# Patient Record
Sex: Male | Born: 2000
Health system: Southern US, Community
[De-identification: ages and names within clinical notes are randomized; demographics above are authoritative.]

## PROBLEM LIST (undated history)

## (undated) DIAGNOSIS — F909 Attention-deficit hyperactivity disorder, unspecified type: Secondary | ICD-10-CM

## (undated) HISTORY — PX: HEMANGIOMA EXCISION: SHX1734

## (undated) HISTORY — PX: URETHRAL DILATION: SUR417

---
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
## 2019-02-14 ENCOUNTER — Ambulatory Visit (INDEPENDENT_AMBULATORY_CARE_PROVIDER_SITE_OTHER): Payer: Managed Care, Other (non HMO) | Admitting: Family Medicine

## 2019-02-14 ENCOUNTER — Other Ambulatory Visit: Payer: Self-pay

## 2019-02-14 ENCOUNTER — Encounter: Payer: Self-pay | Admitting: Family Medicine

## 2019-02-14 DIAGNOSIS — J329 Chronic sinusitis, unspecified: Secondary | ICD-10-CM

## 2019-02-14 DIAGNOSIS — J310 Chronic rhinitis: Secondary | ICD-10-CM | POA: Diagnosis not present

## 2019-02-14 MED ORDER — CEFPROZIL 500 MG PO TABS: 500.0000 mg | ORAL_TABLET | Freq: Two times a day (BID) | ORAL | 0 refills | Status: DC

## 2019-02-14 NOTE — Progress Notes (Signed)
   Subjective:    Patient ID: Kurt Hoover, male    DOB: 16-Dec-2000, 19 y.o.   MRN: [id number]  Fever  This is a new problem. Episode onset: 2 days ago-(Tuesday) Associated symptoms include congestion and coughing. Associated symptoms comments: fatigue.    Patient had Covid the middle of October.  Review of Systems  Constitutional: Positive for fever.  HENT: Positive for congestion.   Respiratory: Positive for cough.    Virtual Visit via Video Note  I connected with Kurt Hoover on 02/14/19 at  2:00 PM EST by a video enabled telemedicine application and verified that I am speaking with the correct person using two identifiers.  Location: Patient: home Provider: office   I discussed the limitations of evaluation and management by telemedicine and the availability of in person appointments. The patient expressed understanding and agreed to proceed.  History of Present Illness:    Observations/Objective:   Assessment and Plan:   Follow Up Instructions:    I discussed the assessment and treatment plan with the patient. The patient was provided an opportunity to ask questions and all were answered. The patient agreed with the plan and demonstrated an understanding of the instructions.   The patient was advised to call back or seek an in-person evaluation if the symptoms worsen or if the condition fails to improve as anticipated.  I provided22minutes of non-face-to-face time during this encounter. Two d ago  Felt tired and achey   Nausea  Diarrhea  Some nasal disch  Quit smoking   t 99.9   Some ha   symp started        Objective:   Physical Exam  Virtual      Assessment & Plan:  Impression viral syndrome with secondary sinusitis.  Antibiotics prescribed symptom care discussed warning signs discussed.  Chance of a recurrent COVID-19 infection extremely low.  Of course this may change in the future as other variants become more  widespread discussed with patient

## 2019-02-15 ENCOUNTER — Encounter: Payer: Self-pay | Admitting: Family Medicine

## 2019-02-18 ENCOUNTER — Ambulatory Visit: Payer: Managed Care, Other (non HMO) | Attending: Internal Medicine

## 2019-02-18 ENCOUNTER — Other Ambulatory Visit: Payer: Self-pay

## 2019-02-18 DIAGNOSIS — Z20822 Contact with and (suspected) exposure to covid-19: Secondary | ICD-10-CM | POA: Insufficient documentation

## 2019-02-19 LAB — NOVEL CORONAVIRUS, NAA: SARS-CoV-2, NAA: NOT DETECTED

## 2019-02-21 ENCOUNTER — Other Ambulatory Visit: Payer: Self-pay

## 2019-02-21 ENCOUNTER — Encounter: Payer: Self-pay | Admitting: Family Medicine

## 2019-02-21 ENCOUNTER — Ambulatory Visit (INDEPENDENT_AMBULATORY_CARE_PROVIDER_SITE_OTHER): Payer: Managed Care, Other (non HMO) | Admitting: Family Medicine

## 2019-02-21 DIAGNOSIS — J019 Acute sinusitis, unspecified: Secondary | ICD-10-CM

## 2019-02-21 DIAGNOSIS — F902 Attention-deficit hyperactivity disorder, combined type: Secondary | ICD-10-CM

## 2019-02-21 MED ORDER — AMPHETAMINE-DEXTROAMPHET ER 30 MG PO CP24: 30.0000 mg | ORAL_CAPSULE | ORAL | 0 refills | Status: DC

## 2019-02-21 MED ORDER — AZITHROMYCIN 250 MG PO TABS: ORAL_TABLET | ORAL | 0 refills | Status: DC

## 2019-02-21 NOTE — Progress Notes (Signed)
   Subjective:    Patient ID: Kurt Hoover, male    DOB: 27-Aug-2000, 19 y.o.   MRN: [id number]  HPI low grade fever, fatigue, diarrhea, nausea, congestion and cough.  Started one week ago. Had covid in October. Had covid test again on 2/8 because he states he feels the same way as when he had covid. States he had pneumonia when he had covid back in October.  Patient denies any shortness of breath right at the moment he relates a lot of fatigue tiredness unable to go to work today and was unable to work this week  Pt states his amphetamine-dextroamphetamine is making him feel jerky, nervous and anxious. States when he got brand name adderall it did not do that to him.  I did discuss with him how some insurance companies will not cover brand-name he understands this  Virtual Visit via Telephone Note  I connected with ZYIR GASSERT Hoover on 02/21/19 at  3:50 PM EST by telephone and verified that I am speaking with the correct person using two identifiers.  Location: Patient: home Provider: office   I discussed the limitations, risks, security and privacy concerns of performing an evaluation and management service by telephone and the availability of in person appointments. I also discussed with the patient that there may be a patient responsible charge related to this service. The patient expressed understanding and agreed to proceed.   History of Present Illness:    Observations/Objective:   Assessment and Plan:   Follow Up Instructions:    I discussed the assessment and treatment plan with the patient. The patient was provided an opportunity to ask questions and all were answered. The patient agreed with the plan and demonstrated an understanding of the instructions.   The patient was advised to call back or seek an in-person evaluation if the symptoms worsen or if the condition fails to improve as anticipated.  I provided 20 minutes of non-face-to-face time during  this encounter.       Review of Systems  Constitutional: Negative for activity change, chills and fever.  HENT: Positive for congestion. Negative for ear pain and rhinorrhea.   Eyes: Negative for discharge.  Respiratory: Positive for cough. Negative for shortness of breath and wheezing.   Cardiovascular: Negative for chest pain.  Gastrointestinal: Negative for abdominal pain, diarrhea, nausea and vomiting.  Genitourinary: Negative for dysuria and hematuria.  Musculoskeletal: Negative for arthralgias.  Neurological: Negative for weakness and headaches.  Psychiatric/Behavioral: Negative for behavioral problems and confusion.       Objective:   Physical Exam   Today's visit was via telephone Physical exam was not possible for this visit      Assessment & Plan:  ADD-we will try brand-name medicine but if not covered by insurance company he may need to choose a different ADD medicine  Probable acute rhinosinusitis he is not having shortness of breath or wheezing hold off on x-ray currently if progressive troubles or worse notify us antibiotics prescribed warning signs discussed work note given through the weekend return to work on Monday call us sooner if any problems

## 2019-02-22 ENCOUNTER — Encounter: Payer: Self-pay | Admitting: Family Medicine

## 2019-02-25 ENCOUNTER — Telehealth: Payer: Self-pay | Admitting: Family Medicine

## 2019-03-19 NOTE — Telephone Encounter (Signed)
error 

## 2019-05-23 ENCOUNTER — Encounter: Payer: Self-pay | Admitting: Family Medicine

## 2019-05-23 ENCOUNTER — Ambulatory Visit: Payer: Managed Care, Other (non HMO) | Attending: Internal Medicine

## 2019-05-23 ENCOUNTER — Telehealth: Payer: Self-pay | Admitting: *Deleted

## 2019-05-23 ENCOUNTER — Telehealth (INDEPENDENT_AMBULATORY_CARE_PROVIDER_SITE_OTHER): Payer: Managed Care, Other (non HMO) | Admitting: Family Medicine

## 2019-05-23 ENCOUNTER — Other Ambulatory Visit: Payer: Self-pay

## 2019-05-23 DIAGNOSIS — J029 Acute pharyngitis, unspecified: Secondary | ICD-10-CM | POA: Diagnosis not present

## 2019-05-23 DIAGNOSIS — Z20822 Contact with and (suspected) exposure to covid-19: Secondary | ICD-10-CM | POA: Insufficient documentation

## 2019-05-23 MED ORDER — AMOXICILLIN 500 MG PO CAPS: 500.0000 mg | ORAL_CAPSULE | Freq: Three times a day (TID) | ORAL | 0 refills | Status: DC

## 2019-05-23 NOTE — Telephone Encounter (Signed)
[name], wale are scheduled for a virtual visit with your provider today.    Just as we do with appointments in the office, we must obtain your consent to participate.  Your consent will be active for this visit and any virtual visit you may have with one of our providers in the next 365 days.    If you have a MyChart account, I can also send a copy of this consent to you electronically.  All virtual visits are billed to your insurance company just like a traditional visit in the office.  As this is a virtual visit, video technology does not allow for your provider to perform a traditional examination.  This may limit your provider's ability to fully assess your condition.  If your provider identifies any concerns that need to be evaluated in person or the need to arrange testing such as labs, EKG, etc, we will make arrangements to do so.    Although advances in technology are sophisticated, we cannot ensure that it will always work on either your end or our end.  If the connection with a video visit is poor, we may have to switch to a telephone visit.  With either a video or telephone visit, we are not always able to ensure that we have a secure connection.   I need to obtain your verbal consent now.   Are you willing to proceed with your visit today?   Kurt Hoover has provided verbal consent on 05/23/2019 for a virtual visit (video or telephone).   Kyra Manges, LPN 0/25/8527  7:82 AM

## 2019-05-23 NOTE — Progress Notes (Signed)
Patient ID: Kurt Hoover, male    DOB: [birth year], 19 y.o.   MRN: [id number]  Virtual Visit via Telephone Note  I connected with Kurt Hoover on 05/23/19 at  9:30 AM EDT by telephone and verified that I am speaking with the correct person using two identifiers.  Location: Patient: home Provider: office   I discussed the limitations, risks, security and privacy concerns of performing an evaluation and management service by telephone and the availability of in person appointments. I also discussed with the patient that there may be a patient responsible charge related to this service. The patient expressed understanding and agreed to proceed.   Chief Complaint  Patient presents with  . Sore Throat   Subjective:    HPI  Pt called for phone visit. Having sore throat and fever. Started 4 days ago. Tried taking hot showers and salt water gargle.  No other meds. Sore throat has worsened.  Now having some runny nose and coughing.  Non -prod cough.  Has had low grade temp at 99.54F. Was out of work yesterday. Was out of town 1 wk ago on [place] for [place].  Working in environment as Furniture conservator/restorer, not around many people. masking at work and out in community.  No sick contacts at work, no known Covid contacts. Started with allergy symptoms, but got worse with the sore throat.  Medical History Kurt Hoover has a past medical history of ADHD.   Outpatient Encounter Medications as of 05/23/2019  Medication Sig  . amphetamine-dextroamphetamine (ADDERALL XR) 30 MG 24 hr capsule Take 1 capsule (30 mg total) by mouth every morning.  Marland Kitchen amphetamine-dextroamphetamine (ADDERALL XR) 30 MG 24 hr capsule Take 1 capsule (30 mg total) by mouth every morning.  Marland Kitchen amphetamine-dextroamphetamine (ADDERALL XR) 30 MG 24 hr capsule Take 1 capsule (30 mg total) by mouth every morning.  . [DISCONTINUED] albuterol (PROVENTIL HFA;VENTOLIN HFA) 108 (90 Base) MCG/ACT inhaler Inhale 2 puffs into  the lungs every 6 (six) hours as needed for wheezing.  Marland Kitchen amoxicillin (AMOXIL) 500 MG capsule Take 1 capsule (500 mg total) by mouth 3 (three) times daily.  . [DISCONTINUED] azithromycin (ZITHROMAX Z-PAK) 250 MG tablet Take 2 tablets (500 mg) on  Day 1,  followed by 1 tablet (250 mg) once daily on Days 2 through 5.  . [DISCONTINUED] etodolac (LODINE) 400 MG tablet Take one tablet po bid prn hand pain   No facility-administered encounter medications on file as of 05/23/2019.     Review of Systems  Constitutional: Negative for chills and fever.  HENT: Positive for rhinorrhea and sore throat. Negative for congestion, ear discharge, ear pain, postnasal drip, sinus pressure, sinus pain and sneezing.   Eyes: Negative for pain, discharge and itching.  Respiratory: Positive for cough. Negative for wheezing.   Cardiovascular: Negative for chest pain.  Gastrointestinal: Negative for diarrhea, nausea and vomiting.  Skin: Negative for rash.  Neurological: Negative for headaches.     Vitals There were no vitals taken for this visit.  Objective:   Physical Exam  No PE, due to phone visit.  Assessment and Plan   1. Pharyngitis, unspecified etiology - amoxicillin (AMOXIL) 500 MG capsule; Take 1 capsule (500 mg total) by mouth 3 (three) times daily.  Dispense: 30 capsule; Refill: 0    Will treat empirically for strep pharyngitis.  Sore throat with fever. Gave amoxicillin. F/u if not improving in next 3-5 days.  Recommending robitussin/mucinex otc, tylenol or ibuprofen prn.  Increase fluids  and rest.    Also Advising to get covid testing.  Call with results and can given work note and instructions once results are back.  Advising quarantining till results back from covid testing. Symptomatic treatment given for fever and bodyaches with tylenol/ibuprofen and increase in fluids.   Call back if more SOB, coughing, persistent fever or not improving in next 5-7 days.   If positive for covid,  requires quarantining for 10 days from onset of symptoms.  Pt in agreement with plan.    F/u prn.  Follow Up Instructions:    I discussed the assessment and treatment plan with the patient. The patient was provided an opportunity to ask questions and all were answered. The patient agreed with the plan and demonstrated an understanding of the instructions.   The patient was advised to call back or seek an in-person evaluation if the symptoms worsen or if the condition fails to improve as anticipated.  I provided 15 minutes of non-face-to-face time during this encounter.

## 2019-05-24 LAB — SARS-COV-2, NAA 2 DAY TAT

## 2019-05-24 LAB — NOVEL CORONAVIRUS, NAA: SARS-CoV-2, NAA: NOT DETECTED

## 2019-07-17 ENCOUNTER — Other Ambulatory Visit: Payer: Self-pay | Admitting: Family Medicine

## 2019-07-17 NOTE — Telephone Encounter (Signed)
Needs to schedule follow-up office visit Then may pend

## 2019-07-18 NOTE — Telephone Encounter (Signed)
Scheduled 8/2

## 2019-08-12 ENCOUNTER — Encounter: Payer: Managed Care, Other (non HMO) | Admitting: Family Medicine

## 2019-08-12 NOTE — Progress Notes (Signed)
   Subjective:    Patient ID: Kurt Hoover, male    DOB: 03-30-2000, 19 y.o.   MRN: [id number]  HPI Review of Systems     Objective:   Physical Exam        Assessment & Plan:   This encounter was created in error - please disregard.

## 2019-08-22 ENCOUNTER — Other Ambulatory Visit: Payer: Self-pay | Admitting: Family Medicine

## 2019-08-23 ENCOUNTER — Encounter: Payer: Self-pay | Admitting: Family Medicine

## 2019-08-23 NOTE — Telephone Encounter (Signed)
Pt scheduled 8/31
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
## 2019-09-10 ENCOUNTER — Ambulatory Visit: Payer: Managed Care, Other (non HMO) | Admitting: Family Medicine

## 2019-09-10 ENCOUNTER — Other Ambulatory Visit: Payer: Self-pay

## 2019-09-10 ENCOUNTER — Ambulatory Visit
Admission: EM | Admit: 2019-09-10 | Discharge: 2019-09-10 | Disposition: A | Discharge status: Home / Self Care | Payer: Managed Care, Other (non HMO) | Attending: Emergency Medicine | Admitting: Emergency Medicine

## 2019-09-10 DIAGNOSIS — R110 Nausea: Secondary | ICD-10-CM

## 2019-09-10 DIAGNOSIS — Z20822 Contact with and (suspected) exposure to covid-19: Secondary | ICD-10-CM

## 2019-09-10 DIAGNOSIS — R519 Headache, unspecified: Secondary | ICD-10-CM

## 2019-09-10 MED ORDER — ONDANSETRON HCL 4 MG PO TABS: 4.0000 mg | ORAL_TABLET | Freq: Four times a day (QID) | ORAL | 0 refills | Status: DC

## 2019-09-10 NOTE — ED Triage Notes (Signed)
See provider note. 

## 2019-09-10 NOTE — ED Provider Notes (Signed)
[facility]   [id number] 09/10/19 Arrival Time: 1615   CC: COVID symptoms  SUBJECTIVE: History from: patient.  Kurt Hoover is a 19 y.o. male who presents with nausea, headache, and constipation x 2 days.  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  Has tried OTC medications without relief.  Denies aggravating factors.   Denies previous COVID infection.   Denies fever, chills, fatigue, sinus pain, rhinorrhea, sore throat, SOB, wheezing, chest pain, vomiting.   ROS: As per HPI.  All other pertinent ROS negative.     Past Medical History:  Diagnosis Date  . ADHD    Past Surgical History:  Procedure Laterality Date  . HEMANGIOMA EXCISION    . URETHRAL DILATION     No Known Allergies No current facility-administered medications on file prior to encounter.   Current Outpatient Medications on File Prior to Encounter  Medication Sig Dispense Refill  . ADDERALL XR 30 MG 24 hr capsule TAKE (1) CAPSULE BY MOUTH EACH MORNING. 30 capsule 0  . amphetamine-dextroamphetamine (ADDERALL XR) 30 MG 24 hr capsule Take 1 capsule (30 mg total) by mouth every morning. 30 capsule 0  . amphetamine-dextroamphetamine (ADDERALL XR) 30 MG 24 hr capsule Take 1 capsule (30 mg total) by mouth every morning. 30 capsule 0   Social History   Socioeconomic History  . Marital status: Single    Spouse name: Not on file  . Number of children: Not on file  . Years of education: Not on file  . Highest education level: Not on file  Occupational History  . Not on file  Tobacco Use  . Smoking status: Never Smoker  . Smokeless tobacco: Never Used  Vaping Use  . Vaping Use: Every day  Substance and Sexual Activity  . Alcohol use: No  . Drug use: No  . Sexual activity: Not on file  Other Topics Concern  . Not on file  Social History Narrative  . Not on file   Social Determinants of Health   Financial Resource Strain:   . Difficulty of Paying Living Expenses: Not on file  Food  Insecurity:   . Worried About Programme researcher, broadcasting/film/video in the Last Year: Not on file  . Ran Out of Food in the Last Year: Not on file  Transportation Needs:   . Lack of Transportation (Medical): Not on file  . Lack of Transportation (Non-Medical): Not on file  Physical Activity:   . Days of Exercise per Week: Not on file  . Minutes of Exercise per Session: Not on file  Stress:   . Feeling of Stress : Not on file  Social Connections:   . Frequency of Communication with Friends and Family: Not on file  . Frequency of Social Gatherings with Friends and Family: Not on file  . Attends Religious Services: Not on file  . Active Member of Clubs or Organizations: Not on file  . Attends Banker Meetings: Not on file  . Marital Status: Not on file  Intimate Partner Violence:   . Fear of Current or Ex-Partner: Not on file  . Emotionally Abused: Not on file  . Physically Abused: Not on file  . Sexually Abused: Not on file   No family history on file.  OBJECTIVE:  Vitals:   09/10/19 1632  BP: 112/79  Pulse: 81  Resp: 17  Temp: 98.6 F (37 C)  TempSrc: Tympanic  SpO2: 98%  Weight: 150 lb (68 kg)     General  appearance: alert; well-appearing, nontoxic; speaking in full sentences and tolerating own secretions HEENT: NCAT; Ears: EACs clear, TMs pearly gray; Eyes: PERRL.  EOM grossly intact.Nose: nares patent without rhinorrhea, Throat: oropharynx clear, tonsils non erythematous or enlarged, uvula midline  Neck: supple without LAD Lungs: unlabored respirations, symmetrical air entry; cough: absent; no respiratory distress; CTAB Heart: regular rate and rhythm.  Abdomen: soft, nondistended, normal active bowel sounds; nontender to palpation; no guarding  Skin: warm and dry Psychological: alert and cooperative; normal mood and affect   ASSESSMENT & PLAN:  1. Acute nonintractable headache, unspecified headache type   2. Nausea without vomiting   3. Exposure to COVID-19 virus      Meds ordered this encounter  Medications  . ondansetron (ZOFRAN) 4 MG tablet    Sig: Take 1 tablet (4 mg total) by mouth every 6 (six) hours.    Dispense:  12 tablet    Refill:  0    Order Specific Question:   Supervising Provider    Answer:   Eustace Moore [[id number]]    COVID testing ordered.  It will take between 5-7 days for test results.  Someone will contact you regarding abnormal results.    In the meantime: You should remain isolated in your home for 10 days from symptom onset AND greater than 72 hours after symptoms resolution (absence of fever without the use of fever-reducing medication and improvement in respiratory symptoms), whichever is longer Get plenty of rest and push fluids zofran as needed for nausea Try miralax twice a day for constipation.  Drink half your body weight in ounces.  Try miralax in coffee for an extra kick Use medications daily for symptom relief Use OTC medications like ibuprofen or tylenol as needed fever or pain Call or go to the ED if you have any new or worsening symptoms such as fever, cough, shortness of breath, chest tightness, chest pain, turning blue, changes in mental status, etc...   Reviewed expectations re: course of current medical issues. Questions answered. Outlined signs and symptoms indicating need for more acute intervention. Patient verbalized understanding. After Visit Summary given.         Rennis Harding, PA-C 09/10/19 1647

## 2019-09-10 NOTE — Discharge Instructions (Signed)
COVID testing ordered.  It will take between 5-7 days for test results.  Someone will contact you regarding abnormal results.    In the meantime: You should remain isolated in your home for 10 days from symptom onset AND greater than 72 hours after symptoms resolution (absence of fever without the use of fever-reducing medication and improvement in respiratory symptoms), whichever is longer Get plenty of rest and push fluids zofran as needed for nausea Try miralax twice a day for constipation.  Drink half your body weight in ounces.  Try miralax in coffee for an extra kick Use medications daily for symptom relief Use OTC medications like ibuprofen or tylenol as needed fever or pain Call or go to the ED if you have any new or worsening symptoms such as fever, cough, shortness of breath, chest tightness, chest pain, turning blue, changes in mental status, etc..Marland Kitchen

## 2019-09-12 LAB — NOVEL CORONAVIRUS, NAA: SARS-CoV-2, NAA: NOT DETECTED

## 2019-12-10 ENCOUNTER — Ambulatory Visit
Admission: EM | Admit: 2019-12-10 | Discharge: 2019-12-10 | Disposition: A | Discharge status: Home / Self Care | Payer: BC Managed Care – PPO | Attending: Emergency Medicine | Admitting: Emergency Medicine

## 2019-12-10 ENCOUNTER — Other Ambulatory Visit: Payer: Self-pay

## 2019-12-10 ENCOUNTER — Encounter: Payer: Self-pay | Admitting: Emergency Medicine

## 2019-12-10 DIAGNOSIS — J029 Acute pharyngitis, unspecified: Secondary | ICD-10-CM | POA: Diagnosis not present

## 2019-12-10 LAB — POCT RAPID STREP A (OFFICE): Rapid Strep A Screen: NEGATIVE

## 2019-12-10 MED ORDER — LIDOCAINE VISCOUS HCL 2 % MT SOLN: 15.0000 mL | OROMUCOSAL | 0 refills | Status: DC | PRN

## 2019-12-10 NOTE — ED Triage Notes (Signed)
Patient states that he has sore throat now for last 4 days. wants a strep test. neg covid test x 2 days ago.

## 2019-12-10 NOTE — ED Provider Notes (Signed)
[facility]   [id number] 12/10/19 Arrival Time: 1250  [id number] THROAT  SUBJECTIVE: History from: patient.  Kurt Hoover is a 19 y.o. male who presented to the urgent care for complaint of sore throat for the past 4 days.  Reported negative COVID-19 test 2 days ago.  Denies sick exposure to strep, flu or mono, or precipitating event.  Has tried OTC medication without relief.  Symptoms are made worse with swallowing, but tolerating liquids and own secretions without difficulty.  Denies previous symptoms in the past.  Denies fever, chills, fatigue, ear pain, sinus pain, rhinorrhea, nasal congestion, cough, SOB, wheezing, chest pain, nausea, rash, changes in bowel or bladder habits.     ROS: As per HPI.  All other pertinent ROS negative.      Past Medical History:  Diagnosis Date  . ADHD    Past Surgical History:  Procedure Laterality Date  . HEMANGIOMA EXCISION    . URETHRAL DILATION     No Known Allergies No current facility-administered medications on file prior to encounter.   Current Outpatient Medications on File Prior to Encounter  Medication Sig Dispense Refill  . ADDERALL XR 30 MG 24 hr capsule TAKE (1) CAPSULE BY MOUTH EACH MORNING. 30 capsule 0  . amphetamine-dextroamphetamine (ADDERALL XR) 30 MG 24 hr capsule Take 1 capsule (30 mg total) by mouth every morning. 30 capsule 0  . amphetamine-dextroamphetamine (ADDERALL XR) 30 MG 24 hr capsule Take 1 capsule (30 mg total) by mouth every morning. 30 capsule 0  . ondansetron (ZOFRAN) 4 MG tablet Take 1 tablet (4 mg total) by mouth every 6 (six) hours. 12 tablet 0   Social History   Socioeconomic History  . Marital status: Single    Spouse name: Not on file  . Number of children: Not on file  . Years of education: Not on file  . Highest education level: Not on file  Occupational History  . Not on file  Tobacco Use  . Smoking status: Never Smoker  . Smokeless tobacco: Never Used  Vaping Use  .  Vaping Use: Every day  Substance and Sexual Activity  . Alcohol use: No  . Drug use: No  . Sexual activity: Not on file  Other Topics Concern  . Not on file  Social History Narrative  . Not on file   Social Determinants of Health   Financial Resource Strain:   . Difficulty of Paying Living Expenses: Not on file  Food Insecurity:   . Worried About Programme researcher, broadcasting/film/video in the Last Year: Not on file  . Ran Out of Food in the Last Year: Not on file  Transportation Needs:   . Lack of Transportation (Medical): Not on file  . Lack of Transportation (Non-Medical): Not on file  Physical Activity:   . Days of Exercise per Week: Not on file  . Minutes of Exercise per Session: Not on file  Stress:   . Feeling of Stress : Not on file  Social Connections:   . Frequency of Communication with Friends and Family: Not on file  . Frequency of Social Gatherings with Friends and Family: Not on file  . Attends Religious Services: Not on file  . Active Member of Clubs or Organizations: Not on file  . Attends Banker Meetings: Not on file  . Marital Status: Not on file  Intimate Partner Violence:   . Fear of Current or Ex-Partner: Not on file  . Emotionally Abused: Not on  file  . Physically Abused: Not on file  . Sexually Abused: Not on file   No family history on file.  OBJECTIVE:  Vitals:   12/10/19 1316  BP: 120/76  Pulse: 80  Resp: 16  Temp: 98 F (36.7 C)  SpO2: 98%     General appearance: alert; appears fatigued, but nontoxic, speaking in full sentences and managing own secretions HEENT: NCAT; Ears: EACs clear, TMs pearly gray with visible cone of light, without erythema; Eyes: PERRL, EOMI grossly; Nose: no obvious rhinorrhea; Throat: oropharynx clear, tonsils 1+ and mildly erythematous without white tonsillar exudates, uvula midline Neck: supple without LAD Lungs: CTA bilaterally without adventitious breath sounds; cough absent Heart: regular rate and rhythm.   Radial pulses 2+ symmetrical bilaterally Skin: warm and dry Psychological: alert and cooperative; normal mood and affect  LABS: Results for orders placed or performed during the hospital encounter of 12/10/19 (from the past 24 hour(s))  POCT rapid strep A     Status: None   Collection Time: 12/10/19  1:53 PM  Result Value Ref Range   Rapid Strep A Screen Negative Negative     ASSESSMENT & PLAN:  1. Sore throat     Meds ordered this encounter  Medications  . lidocaine (XYLOCAINE) 2 % solution    Sig: Use as directed 15 mLs in the mouth or throat as needed for mouth pain.    Dispense:  100 mL    Refill:  0   Discharge instructions   Strep test negative, will send out for culture and we will call you with results Viscous lidocaine prescribed.  This is an oral solution you can swish, and gargle as needed for symptomatic relief of sore throat.  Do not exceed 8 doses in a 24 hour period.  Do not use prior to eating, as this will numb your entire mouth.   Drink warm or cool liquids, use throat lozenges, or popsicles to help alleviate symptoms Take OTC ibuprofen or tylenol as needed for pain Follow up with PCP if symptoms persists Return or go to ER if patient has any new or worsening symptoms such as fever, chills, nausea, vomiting, worsening sore throat, cough, abdominal pain, chest pain, changes in bowel or bladder habits, etc...  Reviewed expectations re: course of current medical issues. Questions answered. Outlined signs and symptoms indicating need for more acute intervention. Patient verbalized understanding. After Visit Summary given.         Durward Parcel, FNP 12/10/19 1404

## 2019-12-10 NOTE — Discharge Instructions (Signed)
Strep test negative, will send out for culture and we will call you with results Viscous lidocaine prescribed.  This is an oral solution you can swish, and gargle as needed for symptomatic relief of sore throat.  Do not exceed 8 doses in a 24 hour period.  Do not use prior to eating, as this will numb your entire mouth.   Drink warm or cool liquids, use throat lozenges, or popsicles to help alleviate symptoms Take OTC ibuprofen or tylenol as needed for pain Follow up with PCP if symptoms persists Return or go to ER if patient has any new or worsening symptoms such as fever, chills, nausea, vomiting, worsening sore throat, cough, abdominal pain, chest pain, changes in bowel or bladder habits, etc..Marland Kitchen

## 2019-12-13 LAB — CULTURE, GROUP A STREP (THRC)

## 2020-07-28 ENCOUNTER — Other Ambulatory Visit: Payer: Self-pay

## 2020-07-28 ENCOUNTER — Ambulatory Visit
Admission: EM | Admit: 2020-07-28 | Discharge: 2020-07-28 | Disposition: A | Discharge status: Home / Self Care | Payer: BC Managed Care – PPO | Attending: Urgent Care | Admitting: Urgent Care

## 2020-07-28 ENCOUNTER — Encounter: Payer: Self-pay | Admitting: Emergency Medicine

## 2020-07-28 DIAGNOSIS — R112 Nausea with vomiting, unspecified: Secondary | ICD-10-CM

## 2020-07-28 DIAGNOSIS — A084 Viral intestinal infection, unspecified: Secondary | ICD-10-CM

## 2020-07-28 MED ORDER — LOPERAMIDE HCL 2 MG PO CAPS: 2.0000 mg | ORAL_CAPSULE | Freq: Two times a day (BID) | ORAL | 0 refills | Status: DC | PRN

## 2020-07-28 MED ORDER — ONDANSETRON 8 MG PO TBDP: 8.0000 mg | ORAL_TABLET | Freq: Three times a day (TID) | ORAL | 0 refills | Status: DC | PRN

## 2020-07-28 NOTE — ED Triage Notes (Addendum)
Vomiting that started on Sunday and headache.  No vomiting since then but having sharp stomach pains and body aches.  2 neg at home covid test

## 2020-07-28 NOTE — ED Provider Notes (Signed)
[facility]   MRN: [id number] DOB: 2000/09/11  Subjective:   Kurt Hoover is a 20 y.o. male presenting for 3-day history of diarrhea, abdominal pain, malaise, headache and vomiting.  Initially symptoms started out with headache and vomiting but only had 1 episode of this.  He has had much more profuse diarrhea which has improved today but is still there.  He did have contact with somebody that had traveled and had similar symptoms.  He took 2 test for COVID-19 and both were negative.  Has a history of COVID and reports that this feels different.  Denies cough, chest pain, shortness of breath.  No current facility-administered medications for this encounter.  Current Outpatient Medications:    ADDERALL XR 30 MG 24 hr capsule, TAKE (1) CAPSULE BY MOUTH EACH MORNING., Disp: 30 capsule, Rfl: 0   amphetamine-dextroamphetamine (ADDERALL XR) 30 MG 24 hr capsule, Take 1 capsule (30 mg total) by mouth every morning., Disp: 30 capsule, Rfl: 0   amphetamine-dextroamphetamine (ADDERALL XR) 30 MG 24 hr capsule, Take 1 capsule (30 mg total) by mouth every morning., Disp: 30 capsule, Rfl: 0   lidocaine (XYLOCAINE) 2 % solution, Use as directed 15 mLs in the mouth or throat as needed for mouth pain., Disp: 100 mL, Rfl: 0   ondansetron (ZOFRAN) 4 MG tablet, Take 1 tablet (4 mg total) by mouth every 6 (six) hours., Disp: 12 tablet, Rfl: 0   No Known Allergies  Past Medical History:  Diagnosis Date   ADHD      Past Surgical History:  Procedure Laterality Date   HEMANGIOMA EXCISION     URETHRAL DILATION      No family history on file.  Social History   Tobacco Use   Smoking status: Never   Smokeless tobacco: Never  Vaping Use   Vaping Use: Every day  Substance Use Topics   Alcohol use: No   Drug use: No    ROS   Objective:   Vitals: BP 123/79 (BP Location: Right Arm)   Pulse 86   Temp 98 F (36.7 C) (Oral)   Resp 18   SpO2 97%   Physical  Exam Constitutional:      General: He is not in acute distress.    Appearance: Normal appearance. He is well-developed. He is not ill-appearing, toxic-appearing or diaphoretic.  HENT:     Head: Normocephalic and atraumatic.     Right Ear: External ear normal.     Left Ear: External ear normal.     Nose: Nose normal.     Mouth/Throat:     Mouth: Mucous membranes are moist.     Pharynx: Oropharynx is clear.  Eyes:     General: No scleral icterus.    Extraocular Movements: Extraocular movements intact.     Pupils: Pupils are equal, round, and reactive to light.  Cardiovascular:     Rate and Rhythm: Normal rate and regular rhythm.     Heart sounds: Normal heart sounds. No murmur heard.   No friction rub. No gallop.  Pulmonary:     Effort: Pulmonary effort is normal. No respiratory distress.     Breath sounds: Normal breath sounds. No stridor. No wheezing, rhonchi or rales.  Abdominal:     General: There is no distension.     Palpations: Abdomen is soft. There is no mass.     Tenderness: There is abdominal tenderness (mild over mid abdomen). There is no guarding or rebound.  Comments: Slightly hyperactive bowel sounds.  Skin:    General: Skin is warm and dry.  Neurological:     Mental Status: He is alert and oriented to person, place, and time.  Psychiatric:        Mood and Affect: Mood normal.        Behavior: Behavior normal.        Thought Content: Thought content normal.      Assessment and Plan :   PDMP not reviewed this encounter.  1. Viral gastroenteritis   2. Nausea vomiting and diarrhea     Will manage for suspected viral gastroenteritis, colitis with supportive care.  Recommended patient hydrate well, eat light meals and maintain electrolytes.  Will use Zofran and Imodium for nausea, vomiting and diarrhea.  Deferred COVID-19 testing.  Counseled patient on potential for adverse effects with medications prescribed/recommended today, ER and return-to-clinic  precautions discussed, patient verbalized understanding.    Wallis Bamberg, PA-C 07/28/20 1630

## 2020-07-28 NOTE — Discharge Instructions (Addendum)

## 2020-11-24 ENCOUNTER — Ambulatory Visit: Payer: Self-pay

## 2020-11-26 ENCOUNTER — Other Ambulatory Visit: Payer: Self-pay

## 2020-11-26 ENCOUNTER — Ambulatory Visit (INDEPENDENT_AMBULATORY_CARE_PROVIDER_SITE_OTHER): Payer: Self-pay | Admitting: Family Medicine

## 2020-11-26 ENCOUNTER — Encounter: Payer: Self-pay | Admitting: Family Medicine

## 2020-11-26 VITALS — BP 120/84 | HR 77 | Ht 68.0 in | Wt 185.0 lb

## 2020-11-26 DIAGNOSIS — M7582 Other shoulder lesions, left shoulder: Secondary | ICD-10-CM

## 2020-11-26 MED ORDER — DICLOFENAC SODIUM 75 MG PO TBEC: 75.0000 mg | DELAYED_RELEASE_TABLET | Freq: Two times a day (BID) | ORAL | 0 refills | Status: DC

## 2020-11-26 NOTE — Progress Notes (Signed)
   Subjective:    Patient ID: Kurt Hoover, male    DOB: January 23, 2000, 20 y.o.   MRN: [id number]  HPI Patient is here for L shoulder sharp pain , x  1 week - not sure if happened at work or home Hurts with certain movements.  Causes pain discomfort.  Does not tolerate movement well.  Review of Systems     Objective:   Physical Exam  Decreased range of motion left shoulder Strength good except for internal rotation No signs of a tear This is more tendinosis/tendinitis.  Neck is normal trapezius normal lungs clear heart regular      Assessment & Plan:  Left shoulder pain Recommend gentle range of motion exercises Anti-inflammatories as directed If not improving over the next 6 weeks notify us we will help set up with orthopedics for physical therapy and injections

## 2021-02-16 ENCOUNTER — Other Ambulatory Visit: Payer: Self-pay

## 2021-02-16 ENCOUNTER — Telehealth: Payer: Self-pay | Admitting: Nurse Practitioner

## 2021-02-16 ENCOUNTER — Ambulatory Visit (INDEPENDENT_AMBULATORY_CARE_PROVIDER_SITE_OTHER): Payer: Self-pay | Admitting: Nurse Practitioner

## 2021-02-16 ENCOUNTER — Encounter: Payer: Self-pay | Admitting: Nurse Practitioner

## 2021-02-16 DIAGNOSIS — J029 Acute pharyngitis, unspecified: Secondary | ICD-10-CM

## 2021-02-16 DIAGNOSIS — R1030 Lower abdominal pain, unspecified: Secondary | ICD-10-CM

## 2021-02-16 MED ORDER — ONDANSETRON HCL 4 MG PO TABS: 4.0000 mg | ORAL_TABLET | Freq: Three times a day (TID) | ORAL | 0 refills | Status: DC | PRN

## 2021-02-16 NOTE — Telephone Encounter (Signed)
Mr. janmichael, giraud are scheduled for a virtual visit with your provider today.    Just as we do with appointments in the office, we must obtain your consent to participate.  Your consent will be active for this visit and any virtual visit you may have with one of our providers in the next 365 days.    If you have a MyChart account, I can also send a copy of this consent to you electronically.  All virtual visits are billed to your insurance company just like a traditional visit in the office.  As this is a virtual visit, video technology does not allow for your provider to perform a traditional examination.  This may limit your provider's ability to fully assess your condition.  If your provider identifies any concerns that need to be evaluated in person or the need to arrange testing such as labs, EKG, etc, we will make arrangements to do so.    Although advances in technology are sophisticated, we cannot ensure that it will always work on either your end or our end.  If the connection with a video visit is poor, we may have to switch to a telephone visit.  With either a video or telephone visit, we are not always able to ensure that we have a secure connection.   I need to obtain your verbal consent now.   Are you willing to proceed with your visit today?   DARONTE SHOSTAK II has provided verbal consent on 02/16/2021 for a virtual visit (video or telephone).   Marlowe Shores, LPN 06/11/8364  2:94 PM

## 2021-02-16 NOTE — Progress Notes (Signed)
° °  Subjective:    Patient ID: Kurt Hoover, male    DOB: 2000-04-04, 21 y.o.   MRN: [id number]  HPI   Virtual Visit via Telephone Note  I connected with Kurt Hoover on 02/16/21 at  3:00 PM EST by telephone and verified that I am speaking with the correct person using two identifiers.  Location: Patient: home Provider: office   I discussed the limitations, risks, security and privacy concerns of performing an evaluation and management service by telephone and the availability of in person appointments. I also discussed with the patient that there may be a patient responsible charge related to this service. The patient expressed understanding and agreed to proceed.   History of Present Illness:  Patient reports to clinic via telemedicine with complaints of sore throat, fevers, nasal congestion, nausea, diarrhea, lower abdominal pain that started 4-5 days ago.  Patient states that he has been fever free for 3 days and that his nasal congestion and sore throat are better.  However, patient still complains of intermittent abdominal pain to his lower abdomen that he describes as sharp.  Patient states abdominal pain is worse after eating greasy meals.  Patient states his last episode of diarrhea was last night and that he continues to be nauseous.  Patient denies any vomiting, blood in stool, headache, dizziness.    Patient did 2 home COVID test that were negative.  Observations/Objective: Patient took telehealth visit while at work.  Patient answered all questions appropriately.  No signs and symptoms of distress were noted during telemedicine phone call.  Assessment and Plan:  1. Lower abdominal pain -Due to limitations of telemedicine unable to fully assess differentials. -However suspect possible gallbladder etiology. -Patient instructed to get the following labs: CMP, amylase, lipase, CBC. - ondansetron (ZOFRAN) 4 MG tablet; Take 1 tablet (4 mg total) by mouth  every 8 (eight) hours as needed for nausea or vomiting.  Dispense: 20 tablet; Refill: 0 -Limitations of telemedicine visit discussed in detail. -Patient instructed to come to clinic to be evaluated in 1 to 2 days if symptoms are not improved. -Patient unsure if he can get lab work until Thursday due to transportation issues. -Patient instructed to go to the ED if he develops vomiting or if abdominal pain gets worse.   2. Sore throat -Likely viral etiology -Reassurance that patient is getting better -Come to the clinic to be evaluated if symptoms return.   Follow Up Instructions:    I discussed the assessment and treatment plan with the patient. The patient was provided an opportunity to ask questions and all were answered. The patient agreed with the plan and demonstrated an understanding of the instructions.   The patient was advised to call back or seek an in-person evaluation if the symptoms worsen or if the condition fails to improve as anticipated.  I provided 15 minutes of non-face-to-face time during this encounter.

## 2021-04-19 ENCOUNTER — Encounter: Payer: Self-pay | Admitting: Family Medicine

## 2021-04-19 ENCOUNTER — Ambulatory Visit (INDEPENDENT_AMBULATORY_CARE_PROVIDER_SITE_OTHER): Payer: 59 | Admitting: Family Medicine

## 2021-04-19 VITALS — BP 130/80 | HR 90 | Temp 98.6°F | Ht 68.0 in | Wt 163.0 lb

## 2021-04-19 DIAGNOSIS — A084 Viral intestinal infection, unspecified: Secondary | ICD-10-CM | POA: Diagnosis not present

## 2021-04-19 MED ORDER — ONDANSETRON 8 MG PO TBDP: 8.0000 mg | ORAL_TABLET | Freq: Three times a day (TID) | ORAL | 1 refills | Status: DC | PRN

## 2021-04-19 NOTE — Progress Notes (Signed)
? ?  Subjective:  ? ? Patient ID: Kurt Hoover, male    DOB: 09-13-2000, 21 y.o.   MRN: [id number] ? ?Emesis  ?Associated symptoms include abdominal pain.  ?Started off early this morning multiple episodes of vomiting 8-9 times.  No fever no bloody stools.  Energy level subpar.  Did have intermittent abdominal pain ?No abdominal pain currently. ? ?Review of Systems  ?Gastrointestinal:  Positive for abdominal pain and vomiting.  ?X 1  day - symptoms down to nausea only today ? ?   ?Objective:  ? Physical Exam ?General-in no acute distress ?Eyes-no discharge ?Lungs-respiratory rate normal, CTA ?CV-no murmurs,RRR ?Extremities skin warm dry no edema ?Neuro grossly normal ?Behavior normal, alert ? ?Denies any abdominal pain no tenderness or guarding or rebound ? ? ?   ?Assessment & Plan:  ? ?Viral gastroenteritis ?Supportive measures ?Fluids bland diet ?Zofran as needed ? ? ?Patient did have alcohol misuse earlier last year but is staying away from alcohol currently ? ? ?

## 2021-10-13 ENCOUNTER — Ambulatory Visit: Payer: BC Managed Care – PPO | Admitting: Nurse Practitioner

## 2021-10-13 VITALS — BP 118/76 | Temp 97.5°F | Ht 68.0 in

## 2021-10-13 DIAGNOSIS — G43009 Migraine without aura, not intractable, without status migrainosus: Secondary | ICD-10-CM

## 2021-10-13 MED ORDER — BUPROPION HCL ER (XL) 150 MG PO TB24: 150.0000 mg | ORAL_TABLET | Freq: Every day | ORAL | 0 refills | Status: DC

## 2021-10-13 NOTE — Progress Notes (Signed)
Subjective:    Patient ID: Kurt Hoover, male    DOB: [birth year], 21 y.o.   MRN: [id number]  HPI  Patient arrives with headache off and on for a week an had a bad migraine last night that caused vomiting.   Patient states he had migraines as a child however recently migraines have been more frequent and intense.  Patient admits to getting 1 migraine a month that feels intense.  Patient admits to vomiting, light sensitivity, noise sensitivity.  Patient describes migraines as a left-sided headache with pressure behind his left eye.  Patient denies any vision changes, weakness, numbness tingling, instability.  Patient states that he currently uses Tylenol, hot towel, and turns off lights to help with symptoms.  Patient also admits to being stressed out more on his job.  Patient states that he works 12 hours 3 times a week and works night shift.  Patient also admits to feeling like he is having trouble concentrating.  Patient states he used to be on Adderall.  As a result of his inability to concentrate patient states he feels more stressed out because he feels he may make a mistake.   Review of Systems  Neurological:  Positive for headaches.       Objective:   Physical Exam Vitals reviewed.  Constitutional:      General: He is not in acute distress.    Appearance: Normal appearance. He is normal weight. He is not ill-appearing, toxic-appearing or diaphoretic.  HENT:     Head: Normocephalic and atraumatic.  Cardiovascular:     Rate and Rhythm: Normal rate and regular rhythm.     Pulses: Normal pulses.     Heart sounds: Normal heart sounds. No murmur heard. Pulmonary:     Effort: Pulmonary effort is normal. No respiratory distress.     Breath sounds: Normal breath sounds. No wheezing.  Musculoskeletal:     Cervical back: Normal range of motion and neck supple. No rigidity or tenderness.     Comments: Grossly intact  Lymphadenopathy:     Cervical: No cervical  adenopathy.  Skin:    General: Skin is warm.     Capillary Refill: Capillary refill takes less than 2 seconds.  Neurological:     Mental Status: He is alert.     Cranial Nerves: No cranial nerve deficit, dysarthria or facial asymmetry.     Sensory: No sensory deficit.     Motor: No weakness.     Coordination: Romberg sign negative. Coordination normal.     Gait: Gait normal.     Comments: Grossly intact  Psychiatric:        Mood and Affect: Mood normal.        Behavior: Behavior normal.           Assessment & Plan:   1. Migraine without aura and without status migrainosus, not intractable -Neuro exam normal today -Patient's migraines likely triggered by stress.  We will attempt to manage patient's ability to focus with Wellbutrin to hopefully decrease stress levels. - buPROPion (WELLBUTRIN XL) 150 MG 24 hr tablet; Take 1 tablet (150 mg total) by mouth daily.  Dispense: 30 tablet; Refill: 0 -If Wellbutrin not effective suggest that patient trial  sumatriptan and ibuprofen. -Return to clinic in 2 to 4 weeks for follow-up    Note:  This document was prepared using Dragon voice recognition software and may include unintentional dictation errors. Note - This record has been created using [employer].  Chart  creation errors have been sought, but may not always  have been located. Such creation errors do not reflect on  the standard of medical care.

## 2021-10-15 ENCOUNTER — Encounter: Payer: Self-pay | Admitting: Nurse Practitioner

## 2022-01-22 ENCOUNTER — Encounter: Payer: Self-pay | Admitting: Family Medicine

## 2022-02-19 ENCOUNTER — Other Ambulatory Visit: Payer: Self-pay | Admitting: Family Medicine

## 2022-07-22 ENCOUNTER — Encounter: Payer: Self-pay | Admitting: Family Medicine

## 2022-08-17 ENCOUNTER — Ambulatory Visit: Payer: BC Managed Care – PPO | Admitting: Family Medicine

## 2022-08-17 ENCOUNTER — Encounter: Payer: Self-pay | Admitting: Family Medicine

## 2022-11-11 ENCOUNTER — Ambulatory Visit
Admission: EM | Admit: 2022-11-11 | Discharge: 2022-11-11 | Disposition: A | Discharge status: Home / Self Care | Payer: No Typology Code available for payment source | Attending: Family Medicine | Admitting: Family Medicine

## 2022-11-11 ENCOUNTER — Encounter: Payer: Self-pay | Admitting: Emergency Medicine

## 2022-11-11 DIAGNOSIS — F17210 Nicotine dependence, cigarettes, uncomplicated: Secondary | ICD-10-CM | POA: Insufficient documentation

## 2022-11-11 DIAGNOSIS — Z1152 Encounter for screening for COVID-19: Secondary | ICD-10-CM | POA: Diagnosis not present

## 2022-11-11 DIAGNOSIS — R051 Acute cough: Secondary | ICD-10-CM | POA: Insufficient documentation

## 2022-11-11 DIAGNOSIS — J029 Acute pharyngitis, unspecified: Secondary | ICD-10-CM | POA: Diagnosis present

## 2022-11-11 LAB — POCT RAPID STREP A (OFFICE): Rapid Strep A Screen: NEGATIVE

## 2022-11-11 MED ORDER — LIDOCAINE VISCOUS HCL 2 % MT SOLN
10.0000 mL | OROMUCOSAL | 0 refills | Status: DC | PRN
Start: 2022-11-11 — End: 2023-05-23

## 2022-11-11 MED ORDER — PROMETHAZINE-DM 6.25-15 MG/5ML PO SYRP: 5.0000 mL | ORAL_SOLUTION | Freq: Four times a day (QID) | ORAL | 0 refills | Status: DC | PRN

## 2022-11-11 NOTE — ED Provider Notes (Signed)
[facility]    CSN: [id number] Arrival date & time: 11/11/22  1525      History   Chief Complaint No chief complaint on file.   HPI Kurt Hoover is a 22 y.o. male.   Presenting today with 1 day history of sore throat, cough, hoarseness, congestion.  Denies fever, chills, body aches, chest pain, shortness of breath, abdominal pain, nausea vomiting or diarrhea.  So far not tried anything over-the-counter for symptoms.  Home COVID test was negative.  No known pertinent chronic medical problems.    Past Medical History:  Diagnosis Date   ADHD     Patient Active Problem List   Diagnosis Date Noted   ADD (attention deficit disorder) 04/17/2012    Past Surgical History:  Procedure Laterality Date   HEMANGIOMA EXCISION     URETHRAL DILATION         Home Medications    Prior to Admission medications   Medication Sig Start Date End Date Taking? Authorizing Provider  lidocaine (XYLOCAINE) 2 % solution Use as directed 10 mLs in the mouth or throat every 3 (three) hours as needed. 11/11/22  Yes Particia Nearing, PA-C  promethazine-dextromethorphan (PROMETHAZINE-DM) 6.25-15 MG/5ML syrup Take 5 mLs by mouth 4 (four) times daily as needed. 11/11/22  Yes Particia Nearing, PA-C    Family History History reviewed. No pertinent family history.  Social History Social History   Tobacco Use   Smoking status: Every Day    Types: Cigarettes   Smokeless tobacco: Never  Vaping Use   Vaping status: Every Day  Substance Use Topics   Alcohol use: No   Drug use: No     Allergies   Patient has no known allergies.   Review of Systems Review of Systems Per HPI  Physical Exam Triage Vital Signs ED Triage Vitals  Encounter Vitals Group     BP 11/11/22 1550 134/84     Systolic BP Percentile --      Diastolic BP Percentile --      Pulse Rate 11/11/22 1550 81     Resp 11/11/22 1550 18     Temp 11/11/22 1550 97.9 F (36.6 C)     Temp  Source 11/11/22 1550 Oral     SpO2 11/11/22 1550 97 %     Weight --      Height --      Head Circumference --      Peak Flow --      Pain Score 11/11/22 1552 5     Pain Loc --      Pain Education --      Exclude from Growth Chart --    No data found.  Updated Vital Signs BP 134/84 (BP Location: Right Arm)   Pulse 81   Temp 97.9 F (36.6 C) (Oral)   Resp 18   SpO2 97%   Visual Acuity Right Eye Distance:   Left Eye Distance:   Bilateral Distance:    Right Eye Near:   Left Eye Near:    Bilateral Near:     Physical Exam Vitals and nursing note reviewed.  Constitutional:      Appearance: He is well-developed.  HENT:     Head: Atraumatic.     Right Ear: External ear normal.     Left Ear: External ear normal.     Nose: Nose normal.     Mouth/Throat:     Mouth: Mucous membranes are moist.  Pharynx: Oropharynx is clear. Posterior oropharyngeal erythema present. No oropharyngeal exudate.  Eyes:     Conjunctiva/sclera: Conjunctivae normal.     Pupils: Pupils are equal, round, and reactive to light.  Cardiovascular:     Rate and Rhythm: Normal rate and regular rhythm.  Pulmonary:     Effort: Pulmonary effort is normal. No respiratory distress.     Breath sounds: No wheezing or rales.  Musculoskeletal:        General: Normal range of motion.     Cervical back: Normal range of motion and neck supple.  Lymphadenopathy:     Cervical: No cervical adenopathy.  Skin:    General: Skin is warm and dry.  Neurological:     Mental Status: He is alert and oriented to person, place, and time.  Psychiatric:        Behavior: Behavior normal.      UC Treatments / Results  Labs (all labs ordered are listed, but only abnormal results are displayed) Labs Reviewed  CULTURE, GROUP A STREP ([facility])  SARS CORONAVIRUS 2 (TAT 6-24 HRS)  POCT RAPID STREP A (OFFICE)    EKG   Radiology No results found.  Procedures Procedures (including critical care time)  Medications  Ordered in UC Medications - No data to display  Initial Impression / Assessment and Plan / UC Course  I have reviewed the triage vital signs and the nursing notes.  Pertinent labs & imaging results that were available during my care of the patient were reviewed by me and considered in my medical decision making (see chart for details).     Vital signs and exam overall reassuring and suggestive of a viral respiratory infection.  Rapid strep negative, throat culture and COVID testing pending.  Discussed supportive over-the-counter medications, home care, Phenergan DM, viscous lidocaine.  Work note given.  Return for worsening symptoms.  Final Clinical Impressions(s) / UC Diagnoses   Final diagnoses:  Acute pharyngitis, unspecified etiology  Acute cough     Discharge Instructions      Rapid strep is negative, COVID test and throat culture are pending. I have sent in medications to help with symptoms and we will be in touch if results come back positive.     ED Prescriptions     Medication Sig Dispense Auth. Provider   promethazine-dextromethorphan (PROMETHAZINE-DM) 6.25-15 MG/5ML syrup Take 5 mLs by mouth 4 (four) times daily as needed. 100 mL Particia Nearing, PA-C   lidocaine (XYLOCAINE) 2 % solution Use as directed 10 mLs in the mouth or throat every 3 (three) hours as needed. 100 mL Particia Nearing, [place]      PDMP not reviewed this encounter.   Particia Nearing, [place] 11/11/22 1821

## 2022-11-11 NOTE — ED Triage Notes (Signed)
Sore throat since yesterday and cough and lost voice.  Home Covid test today was negative

## 2022-11-11 NOTE — Discharge Instructions (Signed)
Rapid strep is negative, COVID test and throat culture are pending. I have sent in medications to help with symptoms and we will be in touch if results come back positive.

## 2022-11-12 LAB — SARS CORONAVIRUS 2 (TAT 6-24 HRS): SARS Coronavirus 2: NEGATIVE

## 2022-11-14 LAB — CULTURE, GROUP A STREP (THRC)

## 2022-11-23 ENCOUNTER — Ambulatory Visit (INDEPENDENT_AMBULATORY_CARE_PROVIDER_SITE_OTHER): Payer: No Typology Code available for payment source | Admitting: Family Medicine

## 2022-11-23 VITALS — BP 123/86 | HR 88 | Temp 98.2°F | Ht 68.0 in | Wt 153.0 lb

## 2022-11-23 DIAGNOSIS — J019 Acute sinusitis, unspecified: Secondary | ICD-10-CM | POA: Diagnosis not present

## 2022-11-23 MED ORDER — AMOXICILLIN-POT CLAVULANATE 875-125 MG PO TABS: 1.0000 | ORAL_TABLET | Freq: Two times a day (BID) | ORAL | 0 refills | Status: DC

## 2022-11-23 NOTE — Patient Instructions (Signed)
Antibiotic as prescribed.  Call with concerns (if you worsen or don't improve).  Take care  Dr. Adriana Simas

## 2022-11-23 NOTE — Progress Notes (Signed)
Subjective:  Patient ID: Kurt Hoover, male    DOB: 06/24/2000  Age: 22 y.o. MRN: [id number]  CC: Respiratory symptoms   HPI:  22 year old male presents for evaluation of respiratory symptoms.  Patient has been under the weather for the past 2 weeks.  He reports cough, congestion, sore throat, hoarseness, body aches, headaches, night sweats.  Was recently evaluated urgent care and had negative COVID and strep testing.  He states that he improved symptomatically but and his symptoms worsened a few days ago.  Currently, his primary complaints are congestion, cough, and sore throat.  No fever.  Patient Active Problem List   Diagnosis Date Noted   ADD (attention deficit disorder) 04/17/2012    Social Hx   Social History   Socioeconomic History   Marital status: Single    Spouse name: Not on file   Number of children: Not on file   Years of education: Not on file   Highest education level: Not on file  Occupational History   Not on file  Tobacco Use   Smoking status: Every Day    Types: Cigarettes   Smokeless tobacco: Never  Vaping Use   Vaping status: Every Day  Substance and Sexual Activity   Alcohol use: No   Drug use: No   Sexual activity: Not on file  Other Topics Concern   Not on file  Social History Narrative   Not on file   Social Determinants of Health   Financial Resource Strain: Not on file  Food Insecurity: Not on file  Transportation Needs: Not on file  Physical Activity: Not on file  Stress: Not on file  Social Connections: Not on file    Review of Systems Per HPI  Objective:  BP 123/86   Pulse 88   Temp 98.2 F (36.8 C)   Ht 5\' 8"  (1.727 m)   Wt 153 lb (69.4 kg)   SpO2 98%   BMI 23.26 kg/m      11/23/2022    1:09 PM 11/11/2022    3:50 PM 10/13/2021   11:09 AM  BP/Weight  Systolic BP 123 134 118  Diastolic BP 86 84 76  Wt. (Lbs) 153    BMI 23.26 kg/m2      Physical Exam Vitals and nursing note reviewed.   Constitutional:      General: He is not in acute distress.    Appearance: Normal appearance.  HENT:     Head: Normocephalic and atraumatic.     Right Ear: Tympanic membrane normal.     Left Ear: Tympanic membrane normal.     Mouth/Throat:     Pharynx: Posterior oropharyngeal erythema present.  Eyes:     General:        Right eye: No discharge.        Left eye: No discharge.     Conjunctiva/sclera: Conjunctivae normal.  Cardiovascular:     Rate and Rhythm: Normal rate and regular rhythm.  Pulmonary:     Effort: Pulmonary effort is normal.     Breath sounds: Normal breath sounds. No wheezing, rhonchi or rales.  Lymphadenopathy:     Cervical: Cervical adenopathy present.  Neurological:     Mental Status: He is alert.     Lab Results  Component Value Date   WBC 10.7 10/10/2006   HGB 12.6 10/10/2006   HCT 37.1 10/10/2006   PLT 252 10/10/2006   GLUCOSE 110 (H) 10/10/2006   NA 138 10/10/2006   K 4.1  10/10/2006   CL 104 10/10/2006   CREATININE 0.59 10/10/2006   BUN 16 10/10/2006   CO2 18 (L) 10/10/2006   HGBA1C 5.4 05/22/2017     Assessment & Plan:   22 year old male presents with respiratory symptoms.  Clinical picture most consistent with sinusitis given initial improvement and then worsening of symptoms (double sickening).  Treating with Augmentin.  Meds ordered this encounter  Medications   amoxicillin-clavulanate (AUGMENTIN) 875-125 MG tablet    Sig: Take 1 tablet by mouth 2 (two) times daily.    Dispense:  20 tablet    Refill:  0    Follow-up:  Return if symptoms worsen or fail to improve.  Everlene Other DO [facility]

## 2023-05-18 ENCOUNTER — Ambulatory Visit: Payer: Self-pay

## 2023-05-18 NOTE — Telephone Encounter (Signed)
 Chief Complaint: vomiting Symptoms: 2-3 episodes today of vomiting (none yesterday), diarrhea (resolved by Monday), nausea with sweating Frequency: intermittent since Sunday Pertinent Negatives: Patient denies fever, blood in stool or vomit, head injury or abdominal injury Disposition: [] ED /[] Urgent Care (no appt availability in office) / [x] Appointment(In office/virtual)/ []  [place] [facility]/ [] Home Care/ [] Refused Recommended Disposition /[] [place] [facility]/ []  Follow-up with PCP Additional Notes: Patient's fiance, Caitlin, on the phone for triage with patient. She states she gave him some zofran  which helped with nausea/vomiting but they are concerned he may have had food poisoning from Saturday night. Patient ate out on Saturday night and started  having vomiting and diarrhea Sunday. Patient states his symptoms resolved yesterday but had episodes of vomiting today while out to eat and he had 2 sips from his margarita. Advised patient to drink walk and stick with bland diet. Acute appt scheduled tomorrow with available provider.  Copied from CRM [id number]. Topic: Clinical - Red Word Triage >> May 18, 2023  1:21 PM [place] wrote: Red Word that prompted transfer to Nurse Triage: possible food poisoning- vomiting since Saturday, unable to hold down fluids, sweats with no fever Reason for Disposition  [1] MILD or MODERATE vomiting AND [2] present > 48 hours (2 days) (Exception: Mild vomiting with associated diarrhea.)  Answer Assessment - Initial Assessment Questions 1. VOMITING SEVERITY: "How many times have you vomited in the past 24 hours?"     - MILD:  1 - 2 times/day    - MODERATE: 3 - 5 times/day, decreased oral intake without significant weight loss or symptoms of dehydration    - SEVERE: 6 or more times/day, vomits everything or nearly everything, with significant weight loss, symptoms of dehydration      3-4 times today he tried to take 2 sips of a margarita, no vomiting  yesterday.  2. ONSET: "When did the vomiting begin?"      Sunday morning.  3. FLUIDS: "What fluids or food have you vomited up today?" "Have you been able to keep any fluids down?"     Patient states he has had 3 glasses of water and was able to eat some chips.  4. ABDOMEN PAIN: "Are your having any abdomen pain?" If Yes : "How bad is it and what does it feel like?" (e.g., crampy, dull, intermittent, constant)      None at this time.  5. DIARRHEA: "Is there any diarrhea?" If Yes, ask: "How many times today?"      On Sunday he had diarrhea and it resolved on Monday.  6. CONTACTS: "Is there anyone else in the family with the same symptoms?"      Fiance states she also felt sick/nausea but did not have any vomiting.  7. CAUSE: "What do you think is causing your vomiting?"     They thought it could be related to what they ate Saturday, food poisoning.  8. HYDRATION STATUS: "Any signs of dehydration?" (e.g., dry mouth [not only dry lips], too weak to stand) "When did you last urinate?"     He states he has urinated today.  9. OTHER SYMPTOMS: "Do you have any other symptoms?" (e.g., fever, headache, vertigo, vomiting blood or coffee grounds, recent head injury)     Headache (wakes up with them, ongoing for 2 years), sweating just before vomiting when nauseated  10. PREGNANCY: "Is there any chance you are pregnant?" "When was your last menstrual period?"       N/A.  Protocols  used: Vomiting-A-AH

## 2023-05-19 ENCOUNTER — Ambulatory Visit: Admitting: Nurse Practitioner

## 2023-05-22 ENCOUNTER — Ambulatory Visit: Admitting: Physician Assistant

## 2023-05-23 ENCOUNTER — Telehealth: Admitting: Physician Assistant

## 2023-05-23 ENCOUNTER — Encounter: Payer: Self-pay | Admitting: Physician Assistant

## 2023-05-23 DIAGNOSIS — K219 Gastro-esophageal reflux disease without esophagitis: Secondary | ICD-10-CM | POA: Diagnosis not present

## 2023-05-23 DIAGNOSIS — R110 Nausea: Secondary | ICD-10-CM | POA: Diagnosis not present

## 2023-05-23 DIAGNOSIS — R197 Diarrhea, unspecified: Secondary | ICD-10-CM | POA: Diagnosis not present

## 2023-05-23 MED ORDER — ONDANSETRON HCL 4 MG PO TABS: 4.0000 mg | ORAL_TABLET | Freq: Three times a day (TID) | ORAL | 0 refills | Status: AC | PRN

## 2023-05-23 MED ORDER — OMEPRAZOLE 20 MG PO CPDR: 20.0000 mg | DELAYED_RELEASE_CAPSULE | Freq: Every day | ORAL | 1 refills | Status: AC

## 2023-05-23 NOTE — Assessment & Plan Note (Signed)
 Stable, intermittent symptoms. No vomiting. Zofran  for nausea.

## 2023-05-23 NOTE — Assessment & Plan Note (Signed)
 Patient stable today. Omeprazole for symptoms. Discussed taking medication on an empty stomach. He understands 1 week to full efficacy. Follow up in 1-2 months for in person evaluation.

## 2023-05-23 NOTE — Assessment & Plan Note (Signed)
 Patient stable. Denies blood or mucus in stool. Advised symptom tracker and food diary to monitor symptoms. CMP today. Advised avoiding foods that trigger symptoms. Will follow up for in person evaluation in 1-2 months.

## 2023-05-23 NOTE — Progress Notes (Signed)
   Virtual Visit via Video Note  I connected with Kurt Hoover on 05/23/23 at  4:20 PM EDT by a video enabled telemedicine application and verified that I am speaking with the correct person using two identifiers.  Patient Location: Home Provider Location: Home Office  I discussed the limitations, risks, security, and privacy concerns of performing an evaluation and management service by video and the availability of in person appointments. I also discussed with the patient that there may be a patient responsible charge related to this service. The patient expressed understanding and agreed to proceed.  Subjective: PCP: Bennet Brasil, MD  No chief complaint on file.  Patient presents today with concerns intermittent diarrhea, occasional nausea, and complaints of acid reflux. He reports approximately 2 months on intermittent diarrhea and nausea following spicy or greasy foods. He denies abdominal pain or vomiting. He denies a pattern to symptoms and states symptoms are unpredictable. He states occasional acid reflux symptoms following meals.      ROS: Per HPI  Current Outpatient Medications:    omeprazole (PRILOSEC) 20 MG capsule, Take 1 capsule (20 mg total) by mouth daily., Disp: 30 capsule, Rfl: 1   ondansetron  (ZOFRAN ) 4 MG tablet, Take 1 tablet (4 mg total) by mouth every 8 (eight) hours as needed for nausea or vomiting., Disp: 20 tablet, Rfl: 0  Observations/Objective: There were no vitals filed for this visit. Physical Exam Constitutional:      General: He is not in acute distress.    Appearance: Normal appearance.  Eyes:     Extraocular Movements: Extraocular movements intact.  Pulmonary:     Effort: Pulmonary effort is normal.  Musculoskeletal:     Cervical back: Normal range of motion.  Neurological:     Mental Status: He is alert.     Assessment and Plan: Diarrhea, unspecified type Assessment & Plan: Patient stable. Denies blood or mucus in stool.  Advised symptom tracker and food diary to monitor symptoms. CMP today. Advised avoiding foods that trigger symptoms. Will follow up for in person evaluation in 1-2 months.   Orders: -     [id number]+EGFR  Nausea Assessment & Plan: Stable, intermittent symptoms. No vomiting. Zofran  for nausea.   Orders: -     Ondansetron  HCl; Take 1 tablet (4 mg total) by mouth every 8 (eight) hours as needed for nausea or vomiting.  Dispense: 20 tablet; Refill: 0  Gastroesophageal reflux disease without esophagitis Assessment & Plan: Patient stable today. Omeprazole for symptoms. Discussed taking medication on an empty stomach. He understands 1 week to full efficacy. Follow up in 1-2 months for in person evaluation.   Orders: -     Omeprazole; Take 1 capsule (20 mg total) by mouth daily.  Dispense: 30 capsule; Refill: 1    Follow Up Instructions: Return in about 2 months (around 07/23/2023).   I discussed the assessment and treatment plan with the patient. The patient was provided an opportunity to ask questions, and all were answered. The patient agreed with the plan and demonstrated an understanding of the instructions.   The patient was advised to call back or seek an in-person evaluation if the symptoms worsen or if the condition fails to improve as anticipated.  The above assessment and management plan was discussed with the patient. The patient verbalized understanding of and has agreed to the management plan.   Jearlean Mince Shylin Keizer, PA-C

## 2023-11-27 ENCOUNTER — Ambulatory Visit
Admission: EM | Admit: 2023-11-27 | Discharge: 2023-11-27 | Disposition: A | Discharge status: Home / Self Care | Attending: Nurse Practitioner | Admitting: Nurse Practitioner

## 2023-11-27 DIAGNOSIS — R059 Cough, unspecified: Secondary | ICD-10-CM

## 2023-11-27 DIAGNOSIS — J209 Acute bronchitis, unspecified: Secondary | ICD-10-CM

## 2023-11-27 LAB — POC SOFIA SARS ANTIGEN FIA: SARS Coronavirus 2 Ag: NEGATIVE

## 2023-11-27 MED ORDER — ALBUTEROL SULFATE HFA 108 (90 BASE) MCG/ACT IN AERS: 2.0000 | INHALATION_SPRAY | Freq: Four times a day (QID) | RESPIRATORY_TRACT | 0 refills | Status: AC | PRN

## 2023-11-27 MED ORDER — PREDNISONE 20 MG PO TABS
40.0000 mg | ORAL_TABLET | Freq: Every day | ORAL | 0 refills | Status: AC
Start: 2023-11-27 — End: 2023-12-02

## 2023-11-27 MED ORDER — PROMETHAZINE-DM 6.25-15 MG/5ML PO SYRP: 5.0000 mL | ORAL_SOLUTION | Freq: Four times a day (QID) | ORAL | 0 refills | Status: AC | PRN

## 2023-11-27 NOTE — ED Triage Notes (Addendum)
 Pt states that he has a cough, chest congestion, sob and chest tightness. X3 days  Pt states that he has been taking cough drops.

## 2023-11-27 NOTE — Discharge Instructions (Signed)
 The COVID test was negative. Take medication as prescribed.  During the daytime, you may take over-the-counter Robitussin or Delsym for your cough. Increase fluids and allow for plenty of rest. You may take over-the-counter Tylenol  as needed for pain, fever, or general discomfort. Recommend the use of a humidifier in your bedroom at nighttime during sleep and sleeping elevated on pillows while symptoms persist. Please be advised that your cough may last from days to weeks.  If you continue to have a persistent nagging cough, but are generally feeling well, continue over-the-counter cough medications, fluids, and cough drops.  If you develop symptoms such as fever, chills, wheezing, or other concerns, seek care. Follow-up as needed.

## 2023-11-27 NOTE — ED Provider Notes (Signed)
 [facility]    CSN: [id number] Arrival date & time: 11/27/23  1645      History   Chief Complaint Chief Complaint  Patient presents with   Cough    HPI Kurt Hoover II is a 23 y.o. male.   The history is provided by the patient.   Patient presents for complaints of cough, chest congestion, and chest tightness for the past 3 days.  Patient denies fever, chills, headache, wheezing, difficulty breathing, abdominal pain, nausea, vomiting, diarrhea, or rash.  Patient states he has been taking over-the-counter cough and cold medications and using cough drops.  Patient states symptoms [place]  Denies history of asthma.  Past Medical History:  Diagnosis Date   ADHD     Patient Active Problem List   Diagnosis Date Noted   Diarrhea 05/23/2023   Nausea 05/23/2023   Gastroesophageal reflux disease without esophagitis 05/23/2023   ADD (attention deficit disorder) 04/17/2012    Past Surgical History:  Procedure Laterality Date   HEMANGIOMA EXCISION     URETHRAL DILATION         Home Medications    Prior to Admission medications   Medication Sig Start Date End Date Taking? Authorizing Provider  albuterol  (VENTOLIN  HFA) 108 (90 Base) MCG/ACT inhaler Inhale 2 puffs into the lungs every 6 (six) hours as needed. 11/27/23  Yes Leath-Warren, Etta PARAS, NP  predniSONE (DELTASONE) 20 MG tablet Take 2 tablets (40 mg total) by mouth daily with breakfast for 5 days. 11/27/23 12/02/23 Yes Leath-Warren, Etta PARAS, NP  promethazine -dextromethorphan (PROMETHAZINE -DM) 6.25-15 MG/5ML syrup Take 5 mLs by mouth 4 (four) times daily as needed. 11/27/23  Yes Leath-Warren, Etta PARAS, NP  omeprazole  (PRILOSEC) 20 MG capsule Take 1 capsule (20 mg total) by mouth daily. 05/23/23   Grooms, Courtney, PA-C  ondansetron  (ZOFRAN ) 4 MG tablet Take 1 tablet (4 mg total) by mouth every 8 (eight) hours as needed for nausea or vomiting. 05/23/23   Grooms,  [place]wend[place], PA-C    Family History History reviewed. No pertinent family history.  Social History Social History   Tobacco Use   Smoking status: Former    Types: Cigarettes   Smokeless tobacco: Never  Vaping Use   Vaping status: Every Day  Substance Use Topics   Alcohol use: Yes    Comment: occ   Drug use: No     Allergies   Patient has no known allergies.   Review of Systems Review of Systems Per HPI  Physical Exam Triage Vital Signs ED Triage Vitals  Encounter Vitals Group     BP 11/27/23 1725 (!) 145/90     Girls Systolic BP Percentile --      Girls Diastolic BP Percentile --      Boys Systolic BP Percentile --      Boys Diastolic BP Percentile --      Pulse Rate 11/27/23 1725 79     Resp 11/27/23 1725 18     Temp 11/27/23 1725 98.2 F (36.8 C)     Temp Source 11/27/23 1725 Oral     SpO2 11/27/23 1725 98 %     Weight 11/27/23 1724 170 lb (77.1 kg)     Height 11/27/23 1724 5' 9 (1.753 m)     Head Circumference --      Peak Flow --      Pain Score 11/27/23 1724 0     Pain Loc --      Pain  Education --      Exclude from [employer] Chart --    No data found.  Updated Vital Signs BP (!) 145/90 (BP Location: Right Arm)   Pulse 79   Temp 98.2 F (36.8 C) (Oral)   Resp 18   Ht 5' 9 (1.753 m)   Wt 170 lb (77.1 kg)   SpO2 98%   BMI 25.10 kg/m   Visual Acuity Right Eye Distance:   Left Eye Distance:   Bilateral Distance:    Right Eye Near:   Left Eye Near:    Bilateral Near:     Physical Exam Vitals and nursing note reviewed.  Constitutional:      General: He is not in acute distress.    Appearance: Normal appearance. He is well-developed.  HENT:     Head: Normocephalic and atraumatic.     Right Ear: Tympanic membrane, ear canal and external ear normal.     Left Ear: Tympanic membrane, ear canal and external ear normal.     Nose: Nose normal.     Right Turbinates: Enlarged and swollen.     Left Turbinates: Enlarged and swollen.     Right  Sinus: No maxillary sinus tenderness or frontal sinus tenderness.     Left Sinus: No maxillary sinus tenderness or frontal sinus tenderness.     Mouth/Throat:     Lips: Pink.     Mouth: Mucous membranes are moist.     Pharynx: Uvula midline. Postnasal drip present. No pharyngeal swelling, oropharyngeal exudate, posterior oropharyngeal erythema or uvula swelling.     Comments: Cobblestoning present to posterior oropharynx  Eyes:     Extraocular Movements: Extraocular movements intact.     Conjunctiva/sclera: Conjunctivae normal.     Pupils: Pupils are equal, round, and reactive to light.  Neck:     Thyroid: No thyromegaly.     Trachea: No tracheal deviation.  Cardiovascular:     Rate and Rhythm: Normal rate and regular rhythm.     Pulses: Normal pulses.     Heart sounds: Normal heart sounds.  Pulmonary:     Effort: Pulmonary effort is normal. Prolonged expiration present. No respiratory distress.     Breath sounds: Normal breath sounds. No stridor. No wheezing, rhonchi or rales.  Abdominal:     General: Bowel sounds are normal.     Palpations: Abdomen is soft.     Tenderness: There is no abdominal tenderness.  Musculoskeletal:     Cervical back: Normal range of motion and neck supple.  Skin:    General: Skin is warm and dry.  Neurological:     General: No focal deficit present.     Mental Status: He is alert and oriented to person, place, and time.  Psychiatric:        Mood and Affect: Mood normal.        Behavior: Behavior normal.        Thought Content: Thought content normal.        Judgment: Judgment normal.      UC Treatments / Results  Labs (all labs ordered are listed, but only abnormal results are displayed) Labs Reviewed  POC SOFIA SARS ANTIGEN FIA    EKG   Radiology No results found.  Procedures Procedures (including critical care time)  Medications Ordered in UC Medications - No data to display  Initial Impression / Assessment and Plan / UC Course   I have reviewed the triage vital signs and the nursing notes.  Pertinent labs &  imaging results that were available during my care of the patient were reviewed by me and considered in my medical decision making (see chart for details).  The COVID test was negative.  On exam, the patient's lung sounds are clear throughout, room air sats are at 98%.  The patient is well-appearing, he is in no acute distress, vital signs are stable.  Low suspicion for pneumonia.  On exam, patient with prolonged expiration, he also complains of chest tightness, will treat for acute bronchitis.  Will treat with prednisone 40 mg, and albuterol  inhaler, and Promethazine  DM for the cough.  Supportive care recommendations were provided and discussed with the patient to include fluids, rest, over-the-counter analgesics, and use of a humidifier during sleep.  Discussed indications with the patient regarding follow-up.  Patient was in agreement with this plan of care and verbalizes understanding.  All questions were answered.  Patient stable for discharge.   Final Clinical Impressions(s) / UC Diagnoses   Final diagnoses:  Cough, unspecified type  Acute bronchitis, unspecified organism     Discharge Instructions      The COVID test was negative. Take medication as prescribed.  During the daytime, you may take over-the-counter Robitussin or Delsym for your cough. Increase fluids and allow for plenty of rest. You may take over-the-counter Tylenol  as needed for pain, fever, or general discomfort. Recommend the use of a humidifier in your bedroom at nighttime during sleep and sleeping elevated on pillows while symptoms persist. Please be advised that your cough may last from days to weeks.  If you continue to have a persistent nagging cough, but are generally feeling well, continue over-the-counter cough medications, fluids, and cough drops.  If you develop symptoms such as fever, chills, wheezing, or other concerns, seek  care. Follow-up as needed.     ED Prescriptions     Medication Sig Dispense Auth. Provider   predniSONE (DELTASONE) 20 MG tablet Take 2 tablets (40 mg total) by mouth daily with breakfast for 5 days. 10 tablet Leath-Warren, Etta PARAS, NP   albuterol  (VENTOLIN  HFA) 108 (90 Base) MCG/ACT inhaler Inhale 2 puffs into the lungs every 6 (six) hours as needed. 8 g Leath-Warren, Etta PARAS, NP   promethazine -dextromethorphan (PROMETHAZINE -DM) 6.25-15 MG/5ML syrup Take 5 mLs by mouth 4 (four) times daily as needed. 118 mL Leath-Warren, Etta PARAS, NP      PDMP not reviewed this encounter.   Gilmer Etta PARAS, NP 11/27/23 [phone]
